# Patient Record
Sex: Male | Born: 1985 | Race: Black or African American | Hispanic: No | Marital: Single
Health system: Southern US, Community
[De-identification: ages and names within clinical notes are randomized; demographics above are authoritative.]

---

## 2014-03-04 ENCOUNTER — Encounter: Payer: Self-pay | Admitting: Family Medicine

## 2014-03-04 ENCOUNTER — Ambulatory Visit (INDEPENDENT_AMBULATORY_CARE_PROVIDER_SITE_OTHER): Payer: BC Managed Care – PPO

## 2014-03-04 ENCOUNTER — Ambulatory Visit (INDEPENDENT_AMBULATORY_CARE_PROVIDER_SITE_OTHER): Payer: BC Managed Care – PPO | Admitting: Family Medicine

## 2014-03-04 VITALS — BP 119/74 | HR 79 | Temp 97.9°F | Ht 71.0 in | Wt 175.0 lb

## 2014-03-04 DIAGNOSIS — R110 Nausea: Secondary | ICD-10-CM

## 2014-03-04 DIAGNOSIS — R197 Diarrhea, unspecified: Secondary | ICD-10-CM

## 2014-03-04 DIAGNOSIS — R1012 Left upper quadrant pain: Secondary | ICD-10-CM

## 2014-03-04 LAB — POCT CBC
Granulocyte percent: 63.7 %G (ref 37–80)
HCT, POC: 43.5 % (ref 43.5–53.7)
Hemoglobin: 13.8 g/dL — AB (ref 14.1–18.1)
Lymph, poc: 3 (ref 0.6–3.4)
MCH, POC: 27.7 pg (ref 27–31.2)
MCHC: 31.8 g/dL (ref 31.8–35.4)
MCV: 87 fL (ref 80–97)
MPV: 7.7 fL (ref 0–99.8)
POC Granulocyte: 6.1 (ref 2–6.9)
POC LYMPH PERCENT: 30.9 %L (ref 10–50)
Platelet Count, POC: 211 10*3/uL (ref 142–424)
RBC: 5 M/uL (ref 4.69–6.13)
RDW, POC: 13.3 %
WBC: 9.6 10*3/uL (ref 4.6–10.2)

## 2014-03-04 MED ORDER — ONDANSETRON 8 MG PO TBDP: 8.0000 mg | ORAL_TABLET | Freq: Three times a day (TID) | ORAL | Status: AC | PRN

## 2014-03-04 MED ORDER — HYOSCYAMINE SULFATE 0.125 MG PO TABS: 0.1250 mg | ORAL_TABLET | ORAL | Status: AC | PRN

## 2014-03-04 NOTE — Progress Notes (Signed)
   Subjective:    Patient ID: Samson FredericJames Rigdon, male    DOB: 03/07/1986, 28 y.o.   MRN: [id number]  HPI  Patient c/o nausea, diarrhea, fever, and malaise for a day now.  He states he awoke last night with Nausea and feeling sick.  He has been having some right shoulder discomfort and c/o LUQ abdominal tenderness.  He is feeling washed out.  Review of Systems C/o fever and abdominal pain and N/V/D   No chest pain, SOB, HA, dizziness, vision change,  constipation, or rash.  Objective:   Physical Exam  Vital signs noted  Well developed well nourished male.  HEENT - Head atraumatic Normocephalic                Eyes - PERRLA, Conjuctiva - clear Sclera- Clear EOMI                Ears - EAC's Wnl TM's Wnl Gross Hearing WNL                Throat - oropharanx wnl Respiratory - Lungs CTA bilateral Cardiac - RRR S1 and S2 without murmur GI - Abdomen soft tender left upper quadrant and bowel sounds active x 4 Extremities - No edema. Neuro - Grossly intact.  Results for orders placed in visit on 03/04/14  POCT CBC      Result Value Ref Range   WBC 9.6  4.6 - 10.2 K/uL   Lymph, poc 3.0  0.6 - 3.4   POC LYMPH PERCENT 30.9  10 - 50 %L   POC Granulocyte 6.1  2 - 6.9   Granulocyte percent 63.7  37 - 80 %G   RBC 5.0  4.69 - 6.13 M/uL   Hemoglobin 13.8 (*) 14.1 - 18.1 g/dL   HCT, POC 40.943.5  81.143.5 - 53.7 %   MCV 87.0  80 - 97 fL   MCH, POC 27.7  27 - 31.2 pg   MCHC 31.8  31.8 - 35.4 g/dL   RDW, POC 91.413.3     Platelet Count, POC 211.0  142 - 424 K/uL   MPV 7.7  0 - 99.8 fL    KUB - Stool right side and normal appearing bowel gas pattern left colon   Prelimnary reading by Chrissie NoaWilliam Oxford,FNP Assessment & Plan:  Abdominal pain, left upper quadrant - Plan: POCT CBC, DG Abd 1 View, hyoscyamine (LEVSIN, ANASPAZ) 0.125 MG tablet, Sedimentation rate, CANCELED: POCT SEDIMENTATION RATE  Nausea alone - Plan: POCT CBC, DG Abd 1 View, ondansetron (ZOFRAN ODT) 8 MG disintegrating tablet, Sedimentation  rate, CANCELED: POCT SEDIMENTATION RATE  Diarrhea - Plan: POCT CBC, DG Abd 1 View, hyoscyamine (LEVSIN, ANASPAZ) 0.125 MG tablet, Sedimentation rate, CANCELED: POCT SEDIMENTATION RATE  Deatra CanterWilliam J Oxford FNP

## 2014-03-05 LAB — SEDIMENTATION RATE: Sed Rate: 20 mm/hr — ABNORMAL HIGH (ref 0–15)

## 2015-01-09 ENCOUNTER — Emergency Department (HOSPITAL_COMMUNITY): Payer: Self-pay

## 2015-01-09 ENCOUNTER — Emergency Department (HOSPITAL_COMMUNITY)
Admission: EM | Admit: 2015-01-09 | Discharge: 2015-01-09 | Disposition: A | Discharge status: Other Acute Inpatient Hospital | Payer: Self-pay | Attending: Emergency Medicine | Admitting: Emergency Medicine

## 2015-01-09 ENCOUNTER — Encounter (HOSPITAL_COMMUNITY): Payer: Self-pay | Admitting: *Deleted

## 2015-01-09 DIAGNOSIS — T273XXA Burn of respiratory tract, part unspecified, initial encounter: Secondary | ICD-10-CM

## 2015-01-09 DIAGNOSIS — R061 Stridor: Secondary | ICD-10-CM | POA: Insufficient documentation

## 2015-01-09 DIAGNOSIS — F419 Anxiety disorder, unspecified: Secondary | ICD-10-CM | POA: Insufficient documentation

## 2015-01-09 DIAGNOSIS — Z978 Presence of other specified devices: Secondary | ICD-10-CM

## 2015-01-09 DIAGNOSIS — J705 Respiratory conditions due to smoke inhalation: Secondary | ICD-10-CM | POA: Insufficient documentation

## 2015-01-09 LAB — COMPREHENSIVE METABOLIC PANEL
ALK PHOS: 71 U/L (ref 38–126)
ALT: 16 U/L — ABNORMAL LOW (ref 17–63)
ANION GAP: 13 (ref 5–15)
AST: 28 U/L (ref 15–41)
Albumin: 3.8 g/dL (ref 3.5–5.0)
BUN: 8 mg/dL (ref 6–20)
CHLORIDE: 105 mmol/L (ref 101–111)
CO2: 23 mmol/L (ref 22–32)
Calcium: 9 mg/dL (ref 8.9–10.3)
Creatinine, Ser: 1.29 mg/dL — ABNORMAL HIGH (ref 0.61–1.24)
GFR calc non Af Amer: >60 mL/min (ref >60)
GLUCOSE: 68 mg/dL — AB (ref 70–99)
Potassium: 3.9 mmol/L (ref 3.5–5.1)
Sodium: 141 mmol/L (ref 135–145)
Total Bilirubin: 0.9 mg/dL (ref 0.3–1.2)
Total Protein: 7.4 g/dL (ref 6.5–8.1)

## 2015-01-09 LAB — PREPARE FRESH FROZEN PLASMA
UNIT DIVISION: 0
Unit division: 0

## 2015-01-09 LAB — CBC
HCT: 42.1 % (ref 39.0–52.0)
Hemoglobin: 14.1 g/dL (ref 13.0–17.0)
MCH: 28.8 pg (ref 26.0–34.0)
MCHC: 33.5 g/dL (ref 30.0–36.0)
MCV: 86.1 fL (ref 78.0–100.0)
PLATELETS: 203 10*3/uL (ref 150–400)
RBC: 4.89 MIL/uL (ref 4.22–5.81)
RDW: 14.2 % (ref 11.5–15.5)
WBC: 11 10*3/uL — AB (ref 4.0–10.5)

## 2015-01-09 LAB — PROTIME-INR
INR: 1.11 (ref 0.00–1.49)
Prothrombin Time: 14.5 seconds (ref 11.6–15.2)

## 2015-01-09 LAB — ETHANOL: Alcohol, Ethyl (B): 211 mg/dL — ABNORMAL HIGH (ref <5)

## 2015-01-09 LAB — SAMPLE TO BLOOD BANK

## 2015-01-09 MED ORDER — MIDAZOLAM HCL 2 MG/2ML IJ SOLN
INTRAMUSCULAR | Status: AC
  Filled 2015-01-09: qty 6

## 2015-01-09 MED ORDER — FENTANYL CITRATE (PF) 100 MCG/2ML IJ SOLN
INTRAMUSCULAR | Status: AC
  Filled 2015-01-09: qty 2

## 2015-01-09 MED ORDER — SUCCINYLCHOLINE CHLORIDE 20 MG/ML IJ SOLN
INTRAMUSCULAR | Status: AC | PRN
  Administered 2015-01-09: 100 mg via INTRAVENOUS

## 2015-01-09 MED ORDER — PROPOFOL 1000 MG/100ML IV EMUL
5.0000 ug/kg/min | Freq: Once | INTRAVENOUS | Status: AC
  Administered 2015-01-09: 40 ug/kg/min via INTRAVENOUS

## 2015-01-09 MED ORDER — PROPOFOL 1000 MG/100ML IV EMUL
INTRAVENOUS | Status: AC
  Filled 2015-01-09: qty 100

## 2015-01-09 MED ORDER — ETOMIDATE 2 MG/ML IV SOLN
INTRAVENOUS | Status: AC | PRN
  Administered 2015-01-09: 20 mg via INTRAVENOUS

## 2015-01-09 MED ORDER — SODIUM CHLORIDE 0.9 % IV SOLN
INTRAVENOUS | Status: AC | PRN
  Administered 2015-01-09: 1000 mL via INTRAVENOUS

## 2015-01-09 MED ORDER — MIDAZOLAM HCL 5 MG/5ML IJ SOLN
INTRAMUSCULAR | Status: AC | PRN
  Administered 2015-01-09: 5 mg via INTRAVENOUS

## 2015-01-09 MED ORDER — FENTANYL CITRATE (PF) 100 MCG/2ML IJ SOLN
INTRAMUSCULAR | Status: AC | PRN
  Administered 2015-01-09: 100 ug via INTRAVENOUS

## 2015-01-09 NOTE — ED Notes (Addendum)
Foley inserted by EMT Apolinar JunesBrandon. Sterile technique supervised by RN Mayme GentaHayley. Tubing secured to leg with tape. Peri-care performed. Urine return 250ml. Bag initialed, time and dated.

## 2015-01-09 NOTE — ED Notes (Signed)
Pt monitored by pulse ox, bp cuff, and 12-lead. 
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
## 2015-01-09 NOTE — Progress Notes (Signed)
CO-Ox results called 10.7. Unable to load into system for unknown reason
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
## 2015-01-09 NOTE — ED Notes (Signed)
Blood at bedside

## 2015-01-09 NOTE — Progress Notes (Signed)
The lab system would not cross over. Blood work received at [employer]:20 & ran at 10:23, panic value of [employer] 10.7 called to Gilman ButtnerLiz Kelley RRT, RCP at 10:30 & copy of full results send to the ED.

## 2015-01-09 NOTE — Discharge Instructions (Signed)
Transfer to [facility].  °

## 2015-01-27 ENCOUNTER — Encounter: Payer: Self-pay | Admitting: Family Medicine
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
## 2022-09-13 ENCOUNTER — Other Ambulatory Visit (HOSPITAL_COMMUNITY): Payer: Self-pay
